# Patient Record
Sex: Male | Born: 1990 | Race: White | Hispanic: No | Marital: Single | Smoking: Never smoker
Health system: Southern US, Community
[De-identification: ages and names within clinical notes are randomized; demographics above are authoritative.]

---
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
## 2015-03-13 ENCOUNTER — Encounter: Payer: Self-pay | Admitting: *Deleted

## 2015-03-13 ENCOUNTER — Emergency Department
Admission: EM | Admit: 2015-03-13 | Discharge: 2015-03-13 | Disposition: A | Discharge status: Home / Self Care | Payer: Self-pay | Attending: Emergency Medicine | Admitting: Emergency Medicine

## 2015-03-13 DIAGNOSIS — L600 Ingrowing nail: Secondary | ICD-10-CM | POA: Insufficient documentation

## 2015-03-13 MED ORDER — CEPHALEXIN 500 MG PO CAPS: 500.0000 mg | ORAL_CAPSULE | Freq: Three times a day (TID) | ORAL | Status: AC

## 2015-03-13 MED ORDER — LIDOCAINE HCL (PF) 1 % IJ SOLN
5.0000 mL | Freq: Once | INTRAMUSCULAR | Status: AC
  Administered 2015-03-13: 5 mL via INTRADERMAL
  Filled 2015-03-13: qty 5

## 2015-03-13 MED ORDER — HYDROCODONE-ACETAMINOPHEN 5-325 MG PO TABS: 1.0000 | ORAL_TABLET | ORAL | Status: AC | PRN

## 2015-03-13 NOTE — ED Provider Notes (Signed)
[facility] Emergency Department Provider Note ____________________________________________  Time seen: Approximately 8:47 AM  I have reviewed the triage vital signs and the nursing notes.   HISTORY  Chief Complaint Ingrown Toenail   HPI Jeff Dalton is a 24 y.o. male presents to the emergency department for evaluation of an ingrown toenail. He states that it has been getting worse over the past several months. It is now red with drainage. He has not been taking anything for pain at home.  No past medical history on file.  There are no active problems to display for this patient.   No past surgical history on file.  Current Outpatient Rx  Name  Route  Sig  Dispense  Refill  . cephALEXin (KEFLEX) 500 MG capsule   Oral   Take 1 capsule (500 mg total) by mouth 3 (three) times daily.   40 capsule   0   . HYDROcodone-acetaminophen (NORCO/VICODIN) 5-325 MG per tablet   Oral   Take 1 tablet by mouth every 4 (four) hours as needed.   12 tablet   0     Allergies Review of patient's allergies indicates no known allergies.  No family history on file.  Social History Social History  Substance Use Topics  . Smoking status: Never Smoker   . Smokeless tobacco: Never Used  . Alcohol Use: Not on file    Review of Systems   Constitutional: No fever/chills Eyes: No visual changes. ENT: No congestion or rhinorrhea Cardiovascular: Denies chest pain. Respiratory: Denies shortness of breath. Gastrointestinal: No abdominal pain.  No nausea, no vomiting.  No diarrhea.  No constipation. Genitourinary: Negative for dysuria. Musculoskeletal: Negative for back pain. Skin: Left great toe with ingrown nail Neurological: Negative for headaches, focal weakness or numbness.  10-point ROS otherwise negative.  ____________________________________________   PHYSICAL EXAM:  VITAL SIGNS: ED Triage Vitals  Enc Vitals Group     BP 03/13/15 0844 160/101  mmHg     Pulse Rate 03/13/15 0844 98     Resp 03/13/15 0844 20     Temp 03/13/15 0844 97.9 F (36.6 C)     Temp Source 03/13/15 0844 Oral     SpO2 03/13/15 0844 98 %     Weight 03/13/15 0846 230 lb (104.327 kg)     Height 03/13/15 0846  (1.575 m)     Head Cir --      Peak Flow --      Pain Score 03/13/15 0845 6     Pain Loc --      Pain Edu? --      Excl. in GC? --     Constitutional: Alert and oriented. Well appearing and in no acute distress. Eyes: Conjunctivae are normal. PERRL. EOMI. Head: Atraumatic. Nose: No congestion/rhinnorhea. Mouth/Throat: Mucous membranes are moist.  Oropharynx non-erythematous. No oral lesions. Neck: No stridor. Cardiovascular: Normal rate, regular rhythm.  Good peripheral circulation. Respiratory: Normal respiratory effort.  No retractions. Lungs CTAB. Gastrointestinal: Soft and nontender. No distention. No abdominal bruits.  Musculoskeletal: No lower extremity tenderness nor edema.  No joint effusions. Neurologic:  Normal speech and language. No gross focal neurologic deficits are appreciated. Speech is normal. No gait instability. Skin:  Ingrown nail on the left great toe with purulent drainage and erythema around the base of the nail; Negative for petechiae.  Psychiatric: Mood and affect are normal. Speech and behavior are normal.  ____________________________________________   LABS (all labs ordered are listed, but only abnormal results  are displayed)  Labs Reviewed - No data to display ____________________________________________  EKG   ____________________________________________  RADIOLOGY   ____________________________________________   PROCEDURES  Procedure(s) performed:   Ingrown Toenail Removal:  Procedure explained to the patient and permission to continue was granted.  Digital block via Xylocaine 1% without epinephrine. 4ml.  Left great toe was cleansed with Betadine.  Nail edge lifted from the medial edge  by tweezers. Sharp excision of the nail made in a wedge and the ingrown portion of the nail was removed.  1 ml blood loss estimated.  Patient tolerated procedure well without immediate complications.   ____________________________________________   INITIAL IMPRESSION / ASSESSMENT AND PLAN / ED COURSE  Pertinent labs & imaging results that were available during my care of the patient were reviewed by me and considered in my medical decision making (see chart for details).  Patient was instructed to follow-up with a podiatrist. He was instructed to call for an appointment. He was advised to keep the area clean and dry. He was advised to return to the emergency department for symptoms that change or worsen if unable schedule an appointment. ____________________________________________   FINAL CLINICAL IMPRESSION(S) / ED DIAGNOSES  Final diagnoses:  Nail, ingrown       Chinita Pester, FNP 03/16/15 1836  Emily Filbert, MD 03/18/15 [phone]

## 2015-03-13 NOTE — ED Notes (Signed)
Lidocaine per [facility].

## 2015-03-13 NOTE — ED Notes (Signed)
Left great toe nail ingrown, redness and selling noted.

## 2015-03-13 NOTE — ED Notes (Signed)
Left great toe nail ingrown redness and swollen noted.

## 2015-03-13 NOTE — Discharge Instructions (Signed)
Ingrown Toenail An ingrown toenail occurs when the sharp edge of your toenail grows into the skin. Causes of ingrown toenails include toenails clipped too far back or poorly fitting shoes. Activities involving sudden stops (basketball, tennis) causing "toe jamming" may lead to an ingrown nail. HOME CARE INSTRUCTIONS   Soak the whole foot in warm soapy water for 20 minutes, 3 times per day.  You may lift the edge of the nail away from the sore skin by wedging a small piece of cotton under the corner of the nail. Be careful not to dig (traumatize) and cause more injury to the area.  Wear shoes that fit well. While the ingrown nail is causing problems, sandals may be beneficial.  Trim your toenails regularly and carefully. Cut your toenails straight across, not in a curve. This will prevent injury to the skin at the corners of the toenail.  Keep your feet clean and dry.  Crutches may be helpful early in treatment if walking is painful.  Antibiotics, if prescribed, should be taken as directed.  Return for a wound check in 2 days or as directed.  Only take over-the-counter or prescription medicines for pain, discomfort, or fever as directed by your caregiver. SEEK IMMEDIATE MEDICAL CARE IF:   You have a fever.  You have increasing pain, redness, swelling, or heat at the wound site.  Your toe is not better in 7 days. If conservative treatment is not successful, surgical removal of a portion or all of the nail may be necessary. MAKE SURE YOU:   Understand these instructions.  Will watch your condition.  Will get help right away if you are not doing well or get worse. Document Released: 06/10/2000 Document Revised: 09/05/2011 Document Reviewed: 06/04/2008 Exit[employer] Patient Information 2015 [employer]. This information is not intended to replace advice given to you by your health care provider. Make sure you discuss any questions you have with your health care provider.  

## 2015-08-18 ENCOUNTER — Encounter: Payer: Self-pay | Admitting: Emergency Medicine

## 2015-08-18 ENCOUNTER — Emergency Department
Admission: EM | Admit: 2015-08-18 | Discharge: 2015-08-18 | Disposition: A | Discharge status: Home / Self Care | Payer: Self-pay | Attending: Emergency Medicine | Admitting: Emergency Medicine

## 2015-08-18 DIAGNOSIS — R600 Localized edema: Secondary | ICD-10-CM | POA: Insufficient documentation

## 2015-08-18 DIAGNOSIS — T364X5A Adverse effect of tetracyclines, initial encounter: Secondary | ICD-10-CM | POA: Insufficient documentation

## 2015-08-18 DIAGNOSIS — T783XXA Angioneurotic edema, initial encounter: Secondary | ICD-10-CM | POA: Insufficient documentation

## 2015-08-18 DIAGNOSIS — T7840XA Allergy, unspecified, initial encounter: Secondary | ICD-10-CM

## 2015-08-18 DIAGNOSIS — Z880 Allergy status to penicillin: Secondary | ICD-10-CM | POA: Insufficient documentation

## 2015-08-18 MED ORDER — DIPHENHYDRAMINE HCL 25 MG PO CAPS
25.0000 mg | ORAL_CAPSULE | Freq: Once | ORAL | Status: AC
  Administered 2015-08-18: 25 mg via ORAL
  Filled 2015-08-18: qty 1

## 2015-08-18 MED ORDER — DIPHENHYDRAMINE HCL 50 MG/ML IJ SOLN
25.0000 mg | Freq: Once | INTRAMUSCULAR | Status: AC
  Administered 2015-08-18: 25 mg via INTRAVENOUS
  Filled 2015-08-18: qty 1

## 2015-08-18 MED ORDER — PREDNISONE 20 MG PO TABS: 60.0000 mg | ORAL_TABLET | Freq: Every day | ORAL | Status: AC

## 2015-08-18 MED ORDER — FAMOTIDINE IN NACL 20-0.9 MG/50ML-% IV SOLN
20.0000 mg | Freq: Once | INTRAVENOUS | Status: AC
  Administered 2015-08-18: 20 mg via INTRAVENOUS
  Filled 2015-08-18: qty 50

## 2015-08-18 MED ORDER — METHYLPREDNISOLONE SODIUM SUCC 125 MG IJ SOLR
125.0000 mg | Freq: Once | INTRAMUSCULAR | Status: AC
  Administered 2015-08-18: 125 mg via INTRAVENOUS
  Filled 2015-08-18: qty 2

## 2015-08-18 MED ORDER — DIPHENHYDRAMINE HCL 25 MG PO TABS: 25.0000 mg | ORAL_TABLET | Freq: Four times a day (QID) | ORAL | Status: AC | PRN

## 2015-08-18 MED ORDER — FAMOTIDINE 40 MG PO TABS: 40.0000 mg | ORAL_TABLET | Freq: Every evening | ORAL | Status: AC

## 2015-08-18 NOTE — Discharge Instructions (Signed)
PLEASE STOP MINOCYCLINE AND FOLLOW UP WITH YOUR PHYSICIAN FOR FURTHER EVALUATION OF YOUR ALLERGY AND TREATMENT OF YOUR INFECTION.  Allergies An allergy is an abnormal reaction to a substance by the body's defense system (immune system). Allergies can develop at any age. WHAT CAUSES ALLERGIES? An allergic reaction happens when the immune system mistakenly reacts to a normally harmless substance, called an allergen, as if it were harmful. The immune system releases antibodies to fight the substance. Antibodies eventually release a chemical called histamine into the bloodstream. The release of histamine is meant to protect the body from infection, but it also causes discomfort. An allergic reaction can be triggered by:  Eating an allergen.  Inhaling an allergen.  Touching an allergen. WHAT TYPES OF ALLERGIES ARE THERE? There are many types of allergies. Common types include:  Seasonal allergies. People with this type of allergy are usually allergic to substances that are only present during certain seasons, such as molds and pollens.  Food allergies.  Drug allergies.  Insect allergies.  Animal dander allergies. WHAT ARE SYMPTOMS OF ALLERGIES? Possible allergy symptoms include:  Swelling of the lips, face, tongue, mouth, or throat.  Sneezing, coughing, or wheezing.  Nasal congestion.  Tingling in the mouth.  Rash.  Itching.  Itchy, red, swollen areas of skin (hives).  Watery eyes.  Vomiting.  Diarrhea.  Dizziness.  Lightheadedness.  Fainting.  Trouble breathing or swallowing.  Chest tightness.  Rapid heartbeat. HOW ARE ALLERGIES DIAGNOSED? Allergies are diagnosed with a medical and family history and one or more of the following:  Skin tests.  Blood tests.  A food diary. A food diary is a record of all the foods and drinks you have in a day and of all the symptoms you experience.  The results of an elimination diet. An elimination diet involves  eliminating foods from your diet and then adding them back in one by one to find out if a certain food causes an allergic reaction. HOW ARE ALLERGIES TREATED? There is no cure for allergies, but allergic reactions can be treated with medicine. Severe reactions usually need to be treated at a hospital. HOW CAN REACTIONS BE PREVENTED? The best way to prevent an allergic reaction is by avoiding the substance you are allergic to. Allergy shots and medicines can also help prevent reactions in some cases. People with severe allergic reactions may be able to prevent a life-threatening reaction called anaphylaxis with a medicine given right after exposure to the allergen.   This information is not intended to replace advice given to you by your health care provider. Make sure you discuss any questions you have with your health care provider.   Document Released: 09/06/2002 Document Revised: 07/04/2014 Document Reviewed: 03/25/2014 Elsevier Interactive Patient Education 2016 Else[employer] Inc[employer]  Angioedema Angioedema is a sudden swelling of tissues, often of the skin. It can occur on the face or genitals or in the abdomen or other body parts. The swelling usually develops over a short period and gets better in 24 to 48 hours. It often begins during the night and is found when the person wakes up. The person may also get red, itchy patches of skin (hives). Angioedema can be dangerous if it involves swelling of the air passages.  Depending on the cause, episodes of angioedema may only happen once, come back in unpredictable patterns, or repeat for several years and then gradually fade away.  CAUSES  Angioedema can be caused by an allergic reaction to various triggers. It can also  result from nonallergic causes, including reactions to drugs, immune system disorders, viral infections, or an abnormal gene that is passed to you from your parents (hereditary). For some people with angioedema, the cause is unknown.  Some  things that can trigger angioedema include:   Foods.   Medicines, such as ACE inhibitors, ARBs, nonsteroidal anti-inflammatory agents, or estrogen.   Latex.   Animal saliva.   Insect stings.   Dyes used in X-rays.   Mild injury.   Dental work.  Surgery.  Stress.   Sudden changes in temperature.   Exercise. SIGNS AND SYMPTOMS   Swelling of the skin.  Hives. If these are present, there is also intense itching.  Redness in the affected area.   Pain in the affected area.  Swollen lips or tongue.  Breathing problems. This may happen if the air passages swell.  Wheezing. If internal organs are involved, there may be:   Nausea.   Abdominal pain.   Vomiting.   Difficulty swallowing.   Difficulty passing urine. DIAGNOSIS   Your health care provider will examine the affected area and take a medical and family history.  Various tests may be done to help determine the cause. Tests may include:  Allergy skin tests to see if the problem is an allergic reaction.   Blood tests to check for hereditary angioedema.   Tests to check for underlying diseases that could cause the condition.   A review of your medicines, including over-the-counter medicines, may be done. TREATMENT  Treatment will depend on the cause of the angioedema. Possible treatments include:   Removal of anything that triggered the condition (such as stopping certain medicines).   Medicines to treat symptoms or prevent attacks. Medicines given may include:   Antihistamines.   Epinephrine injection.   Steroids.   Hospitalization may be required for severe attacks. If the air passages are affected, it can be an emergency. Tubes may need to be placed to keep the airway open. HOME CARE INSTRUCTIONS   Take all medicines as directed by your health care provider.  If you were given medicines for emergency allergy treatment, always carry them with you.  Wear a medical  bracelet as directed by your health care provider.   Avoid known triggers. SEEK MEDICAL CARE IF:   You have repeat attacks of angioedema.   Your attacks are more frequent or more severe despite preventive measures.   You have hereditary angioedema and are considering having children. It is important to discuss with your health care provider the risks of passing the condition on to your children. SEEK IMMEDIATE MEDICAL CARE IF:   You have severe swelling of the mouth, tongue, or lips.  You have difficulty breathing.   You have difficulty swallowing.   You faint. MAKE SURE YOU:  Understand these instructions.  Will watch your condition.  Will get help right away if you are not doing well or get worse.   This information is not intended to replace advice given to you by your health care provider. Make sure you discuss any questions you have with your health care provider.   Document Released: 08/22/2001 Document Revised: 07/04/2014 Document Reviewed: 02/04/2013 Elsevier Interactive Patient Education [employer]

## 2015-08-18 NOTE — ED Notes (Addendum)
Pt presents to ED with swelling to his left eye and the left side of his top lip. Denies difficulty breathing at this time. Eye draining slightly. Pt mom states he has "been itching" since the onset of his swelling. Unsure of what may be the cause. Pt currently has no increased work of breathing or acute distress noted. Pt states he is currently taking minocycline for 2 ingrown toenails.

## 2015-08-18 NOTE — ED Provider Notes (Signed)
[facility] Emergency Department Provider Note  ____________________________________________  Time seen: Approximately 442 AM  I have reviewed the triage vital signs and the nursing notes.   HISTORY  Chief Complaint Facial Swelling    HPI Izaac Reisig is a 25 y.o. male who comes into the hospital today with the presumed allergic reaction. The patient reports that he woke up and had some swelling to the left side of his face around his eyes. He reports that it appears to be spreading to the right eye. The patient started taking [place] cycling a few days ago and that is the only change to his medications. Patient reports that he's been on minocycline beforeand has not had these symptoms. He reports he has taken the medication for ingrown toenails and infection. The patient denies any shortness of breath any tongue swelling any throat swelling. He does feel itchy all over. He's never had symptoms like this before but does have some allergies to other antibiotics.   History reviewed. No pertinent past medical history.  There are no active problems to display for this patient.   History reviewed. No pertinent past surgical history.  Current Outpatient Rx  Name  Route  Sig  Dispense  Refill  . diphenhydrAMINE (BENADRYL) 25 MG tablet   Oral   Take 1 tablet (25 mg total) by mouth every 6 (six) hours as needed for itching or allergies.   15 tablet   0   . famotidine (PEPCID) 40 MG tablet   Oral   Take 1 tablet (40 mg total) by mouth every evening.   7 tablet   0   . HYDROcodone-acetaminophen (NORCO/VICODIN) 5-325 MG per tablet   Oral   Take 1 tablet by mouth every 4 (four) hours as needed.   12 tablet   0   . predniSONE (DELTASONE) 20 MG tablet   Oral   Take 3 tablets (60 mg total) by mouth daily.   12 tablet   0     Allergies Amoxicillin and Sulfa antibiotics  History reviewed. No pertinent family history.  Social History Social History   Substance Use Topics  . Smoking status: Never Smoker   . Smokeless tobacco: Never Used  . Alcohol Use: No    Review of Systems Constitutional: No fever/chills Eyes: Eye swelling,  ENT: Lip swelling, No sore throat. Cardiovascular: Denies chest pain. Respiratory: Denies shortness of breath. Gastrointestinal: No abdominal pain.  No nausea, no vomiting.  No diarrhea.  No constipation. Genitourinary: Negative for dysuria. Musculoskeletal: Negative for back pain. Skin: Negative for rash. Neurological: Negative for headaches, focal weakness or numbness.  10-point ROS otherwise negative.  ____________________________________________   PHYSICAL EXAM:  VITAL SIGNS: ED Triage Vitals  Enc Vitals Group     BP 08/18/15 0356 149/91 mmHg     Pulse Rate 08/18/15 0356 98     Resp 08/18/15 0356 18     Temp 08/18/15 0356 97.7 F (36.5 C)     Temp Source 08/18/15 0356 Oral     SpO2 08/18/15 0356 100 %     Weight 08/18/15 0356 262 lb 5.6 oz (119 kg)     Height 08/18/15 0356  (1.575 m)     Head Cir --      Peak Flow --      Pain Score 08/18/15 0356 0     Pain Loc --      Pain Edu? --      Excl. in GC? --  Constitutional: Alert and oriented. Well appearing and in mild distress. Eyes: Some periorbital edema to bilateral eyes, Conjunctivae are normal. PERRL. EOMI. Head: Atraumatic. Nose: No congestion/rhinnorhea. Mouth/Throat: Mucous membranes are moist.  Oropharynx non-erythematous. No swelling or edema to posterior oropharynx, swelling to left upper lip. Cardiovascular: Normal rate, regular rhythm. Grossly normal heart sounds.  Good peripheral circulation. Respiratory: Normal respiratory effort.  No retractions. Lungs CTAB. Gastrointestinal: Soft and nontender. No distention. Positive bowel sounds Musculoskeletal: No lower extremity tenderness nor edema.   Neurologic:  Normal speech and language.  Skin:  Diffuse erythema Psychiatric: Mood and affect are normal.    ____________________________________________   LABS (all labs ordered are listed, but only abnormal results are displayed)  Labs Reviewed - No data to display ____________________________________________  EKG  None ____________________________________________  RADIOLOGY  None ____________________________________________   PROCEDURES  Procedure(s) performed: None  Critical Care performed: No  ____________________________________________   INITIAL IMPRESSION / ASSESSMENT AND PLAN / ED COURSE  Pertinent labs & imaging results that were available during my care of the patient were reviewed by me and considered in my medical decision making (see chart for details).  This is a 25 year old male who comes into the hospital today with some eye swelling and lip swelling with a concern for an allergic reaction. The patient is been taking [place] cycling and although he has taken it before this could be due to that medication. I will give the patient some Solu-Medrol as well as some Pepcid and Benadryl. I will reassess the patient.  After the medication the patient reports that he does feel some improvement in his symptoms. I will write the patient medication for discharge and give him another dose of Benadryl before he is discharged to home. The patient should follow-up with his primary care physician and should stop the [facility] cycling immediately. ____________________________________________   FINAL CLINICAL IMPRESSION(S) / ED DIAGNOSES  Final diagnoses:  Allergic reaction, initial encounter  Angioedema, initial encounter  Periorbital edema      Rebecka Apley, MD 08/18/15 [phone]

## 2021-08-19 ENCOUNTER — Other Ambulatory Visit: Payer: Self-pay | Admitting: Otolaryngology

## 2021-08-19 DIAGNOSIS — H918X9 Other specified hearing loss, unspecified ear: Secondary | ICD-10-CM

## 2021-09-14 ENCOUNTER — Other Ambulatory Visit: Payer: Self-pay

## 2021-09-21 ENCOUNTER — Other Ambulatory Visit: Payer: Self-pay

## 2021-09-30 ENCOUNTER — Inpatient Hospital Stay: Admission: RE | Admit: 2021-09-30 | Payer: Self-pay | Source: Ambulatory Visit

## 2021-10-07 ENCOUNTER — Other Ambulatory Visit: Payer: Self-pay | Admitting: Otolaryngology

## 2021-10-07 ENCOUNTER — Ambulatory Visit
Admission: RE | Admit: 2021-10-07 | Discharge: 2021-10-07 | Disposition: A | Discharge status: Home / Self Care | Payer: Medicaid Other | Source: Ambulatory Visit | Attending: Otolaryngology | Admitting: Otolaryngology

## 2021-10-07 DIAGNOSIS — H918X9 Other specified hearing loss, unspecified ear: Secondary | ICD-10-CM

## 2023-07-30 IMAGING — MR MR HEAD W/O CM
10 of 11 series · 45 of 48 positions shown · non-contrast
Comparison: None.

CLINICAL DATA: 30-year-old male with asymmetric hearing loss. Large
body habitus (5 ft 1", 340 lbs).

EXAM:
MRI HEAD WITHOUT CONTRAST
TECHNIQUE: Multiplanar, multiecho pulse sequences of the brain and surrounding
structures were obtained without intravenous contrast.

[Series 5: T1 · sagittal · 4.5mm · 0.72mm/px · 3 of 30 slices shown (1 of 4)]
[im 1/30]
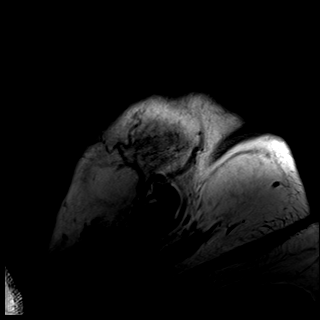
[im 15/30]
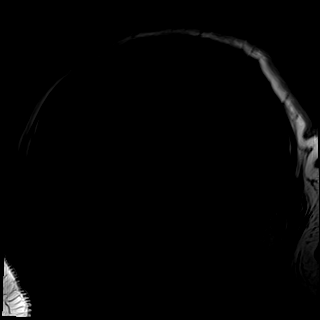
[im 30/30]
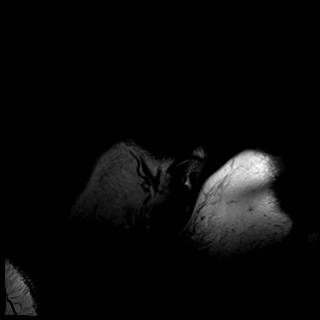

[Series 6: T2 · axial · 4.0mm · 0.36mm/px · z∈[-111,+54]mm · 3 of 33 slices shown]
[im 1/33]
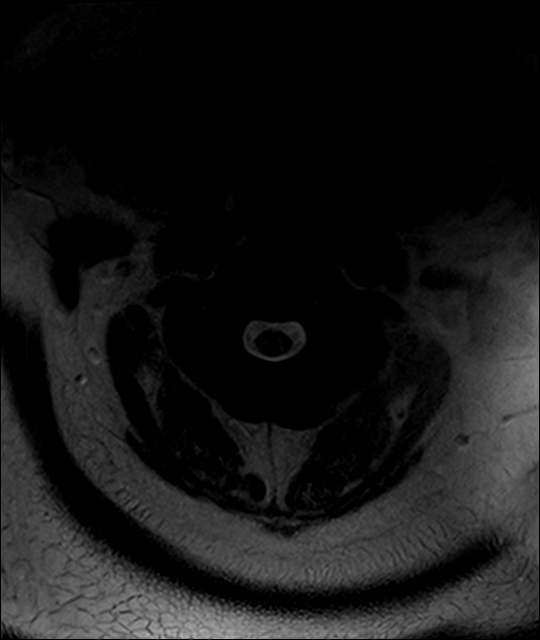
[im 17/33]
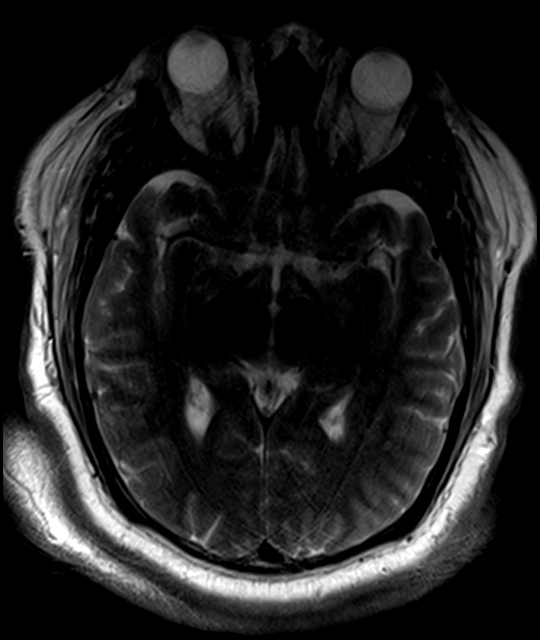
[im 33/33]
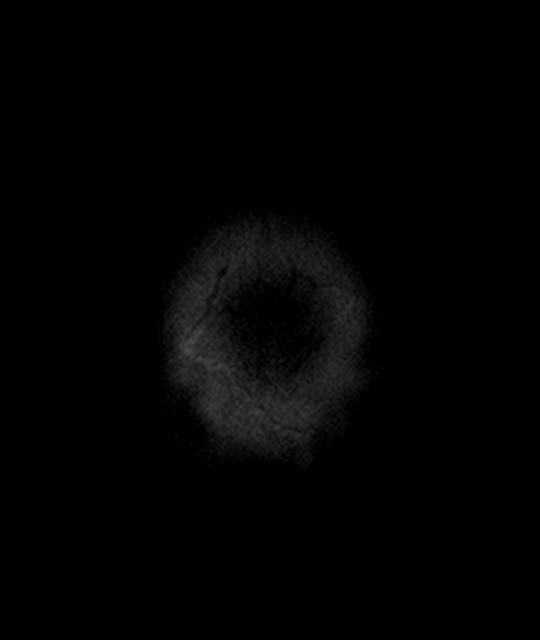

[Series 7: DWI · axial · 3.0mm · 0.94mm/px · z∈[-109,+53]mm · 16 of 184 slices shown]
[im 1/184]
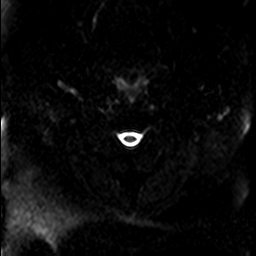
[im 13/184]
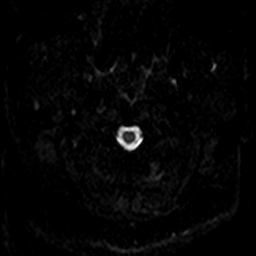
[im 25/184]
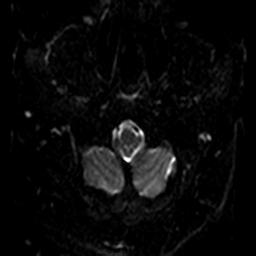
[im 37/184]
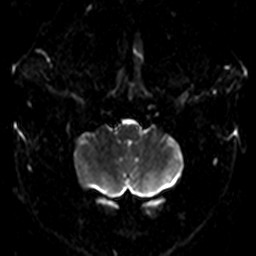
[im 49/184]
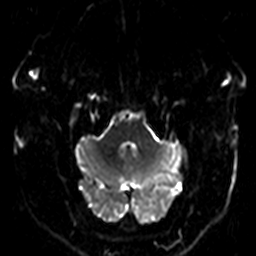
[im 62/184]
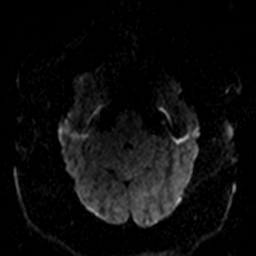
[im 74/184]
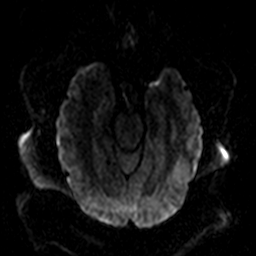
[im 86/184]
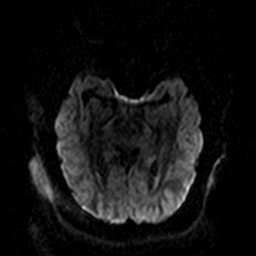
[im 98/184]
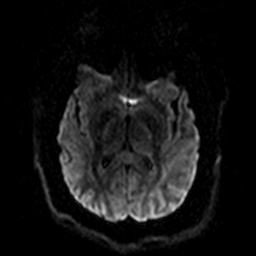
[im 110/184]
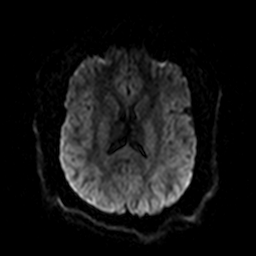
[im 123/184]
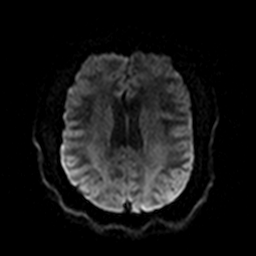
[im 135/184]
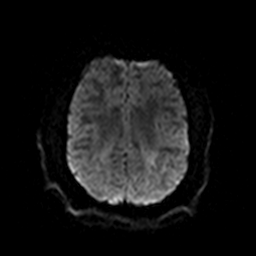
[im 147/184]
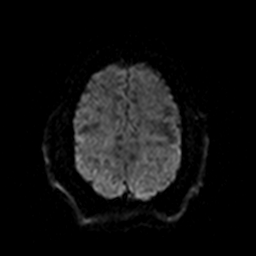
[im 159/184]
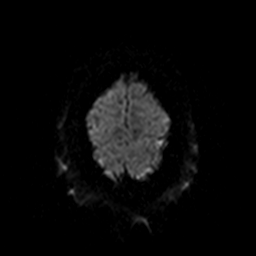
[im 171/184]
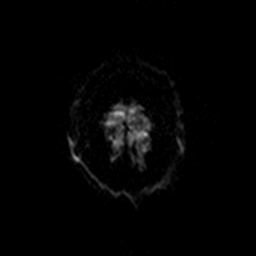
[im 184/184]
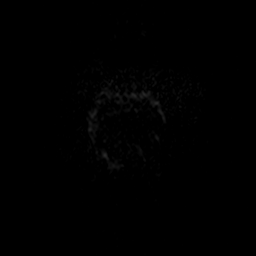

[Series 8: ax dwi_tracew · axial · 3.0mm · 0.94mm/px · z∈[-109,+53]mm · 8 of 92 slices shown]
[im 1/92]
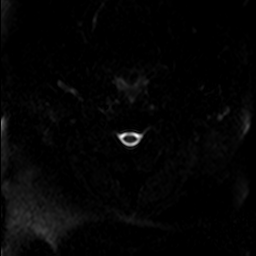
[im 14/92]
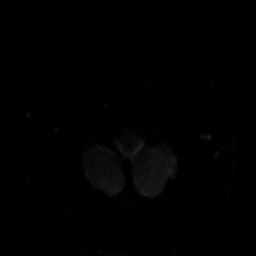
[im 27/92]
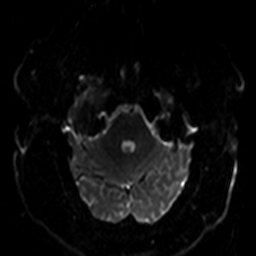
[im 40/92]
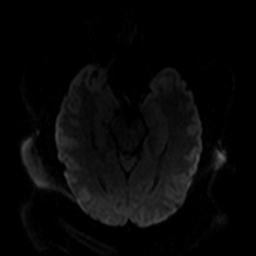
[im 53/92]
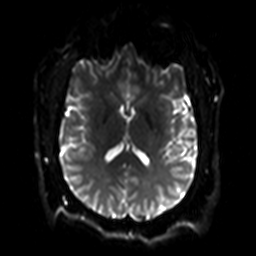
[im 66/92]
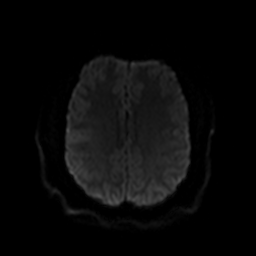
[im 79/92]
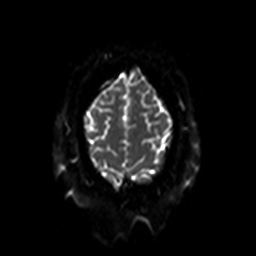
[im 92/92]
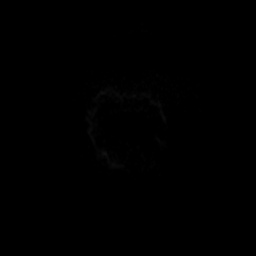

[Series 9: ax dwi_adc · axial · 3.0mm · 0.94mm/px · z∈[-109,+53]mm · 4 of 46 slices shown]
[im 1/46]
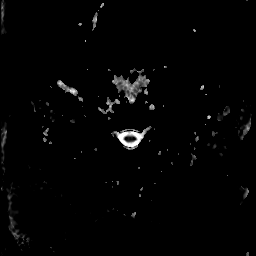
[im 16/46]
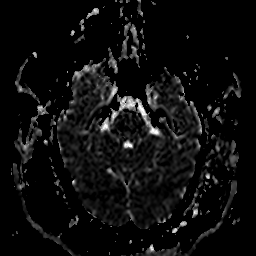
[im 31/46]
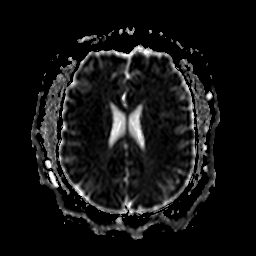
[im 46/46]
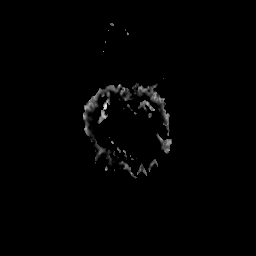

[Series 10: FLAIR · axial · 3.0mm · 0.72mm/px · z∈[-110,+51]mm · 2 of 28 slices shown]
[im 1/28]
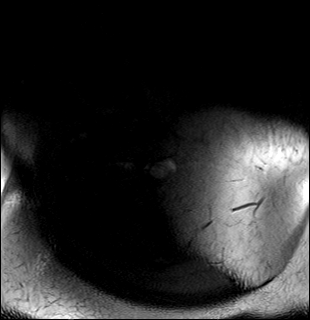
[im 28/28]
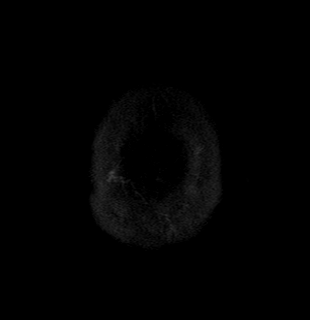

[Series 12: swi_images · axial · 3.0mm · 0.90mm/px · z∈[-105,+48]mm · 4 of 52 slices shown]
[im 1/52]
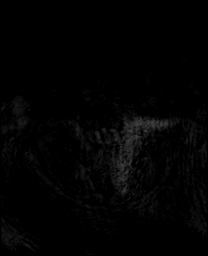
[im 18/52]
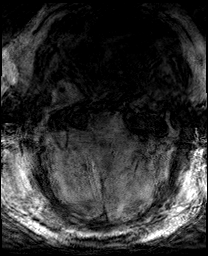
[im 35/52]
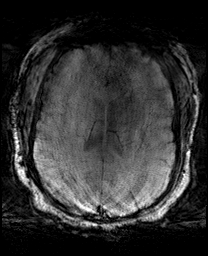
[im 52/52]
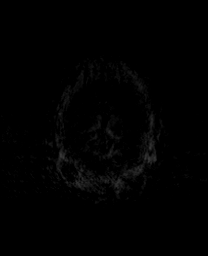

[Series 13: T1 · coronal · 3.0mm · 0.56mm/px · 1 of 13 slices shown (2 of 4)]
[im 1/13]
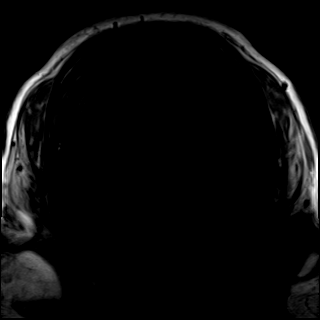

[Series 15: T1 · axial · 3.0mm · 0.50mm/px · 1 of 13 slices shown (3 of 4)]
[im 1/13]
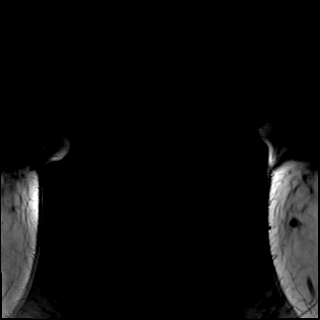

[Series 16: T1 · sagittal · 4.5mm · 0.72mm/px · 3 of 30 slices shown (4 of 4)]
[im 1/30]
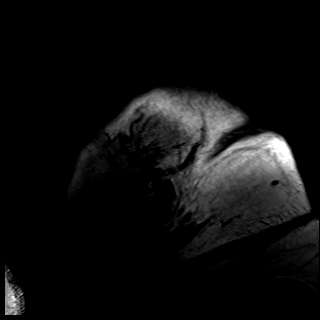
[im 15/30]
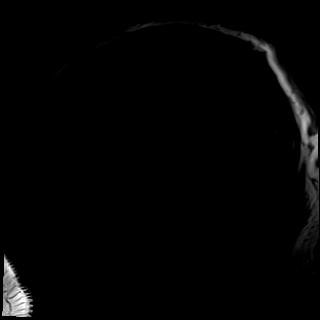
[im 30/30]
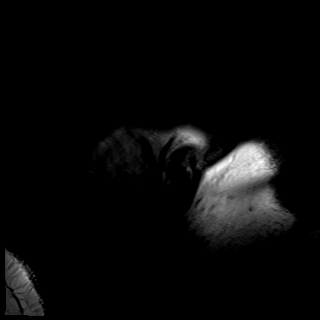

[45 of 48 positions shown; findings below may reference images not displayed]

FINDINGS: A study without and with contrast was planned, but noncontrast image
quality was affected by inability to use the dedicated head coil due
to body habitus, and the patient was unable to tolerate the entire
exam due to difficulty breathing when lying flat. No postcontrast
imaging was obtained.

Brain: Normal cerebral volume. No restricted diffusion to suggest
acute infarction. No midline shift, mass effect, evidence of mass
lesion, ventriculomegaly, extra-axial collection or acute
intracranial hemorrhage. Cervicomedullary junction and pituitary are
within normal limits.

Gray and white matter signal is within normal limits throughout the
brain. No encephalomalacia or chronic cerebral blood products is
evident.

Vascular: Major intracranial vascular flow voids are grossly
preserved.

Skull and upper cervical spine: Grossly negative.

Sinuses/Orbits: Negative orbits. Paranasal sinuses appear well
aerated.

Other: Degraded thin slice noncontrast internal auditory imaging.
Wrap artifact on thin axial T1 imaging.

Cerebellopontine angles appear to be normal on T2 steady state
series 14, image 20. Grossly normal cisternal and intracanalicular
7th and 8th nerve segments. Symmetric appearing T2 signal in the
cochlea and vestibular structures.

Left mastoid air cells appear aerated but there is a mild to
moderate right mastoid effusion (series 6 images 9 and 10). But no
diffusion restriction or complicating features are evident. Visible
periauricular soft tissues and parotid glands appear symmetric and
within normal limits.
IMPRESSION: 1. Limited exam due to body habitus and truncated study due to
patient difficulty lying flat. No postcontrast imaging.
2. Right mastoid effusion, but otherwise grossly normal internal
auditory imaging.
3. Unremarkable noncontrast MRI appearance of the brain.
